# Patient Record
Sex: Male | Born: 1985 | Race: White | Hispanic: No | Marital: Single | Smoking: Never smoker
Health system: Southern US, Community
[De-identification: ages and names within clinical notes are randomized; demographics above are authoritative.]

## PROBLEM LIST (undated history)

## (undated) ENCOUNTER — Ambulatory Visit: Discharge status: Absent without leave | Payer: Self-pay

## (undated) HISTORY — PX: HERNIA REPAIR: SHX51

---

## 2006-04-08 HISTORY — PX: KNEE SURGERY: SHX244

## 2017-01-08 ENCOUNTER — Ambulatory Visit
Admission: EM | Admit: 2017-01-08 | Discharge: 2017-01-08 | Disposition: A | Discharge status: Home / Self Care | Payer: Managed Care, Other (non HMO) | Attending: Family Medicine | Admitting: Family Medicine

## 2017-01-08 DIAGNOSIS — R590 Localized enlarged lymph nodes: Secondary | ICD-10-CM

## 2017-01-08 NOTE — ED Triage Notes (Signed)
Patient complains of swollen glands x 4 weeks. Patient states that throat is not painful, just swollen.

## 2017-01-08 NOTE — Discharge Instructions (Signed)
Monitor for 2 more weeks and follow up if still present

## 2017-01-08 NOTE — ED Provider Notes (Signed)
[facility]    CSN: [id number] Arrival date & time: 01/08/17  1515     History   Chief Complaint Chief Complaint  Patient presents with  . Lymphadenopathy    HPI Derek Sanders is a 31 y.o. male.   31 yo male with a c/o swollen glands just below the jaw for the last 3 weeks since he had an upper respiratory infection. States about 3 week sago he had "head cold" with sore throat, nasal congestion, runny nose and cough, which resolved, however patient has had persistence of the swollen glands. States otherwise feels well. Denies any fevers, chills, weight loss, cough.     The history is provided by the patient.    History reviewed. No pertinent past medical history.  There are no active problems to display for this patient.   Past Surgical History:  Procedure Laterality Date  . HERNIA REPAIR    . KNEE SURGERY Bilateral 2008       Home Medications    Prior to Admission medications   Not on File    Family History History reviewed. No pertinent family history.  Social History Social History  Substance Use Topics  . Smoking status: Never Smoker  . Smokeless tobacco: Never Used  . Alcohol use Yes     Comment: occasionally     Allergies   Patient has no known allergies.   Review of Systems Review of Systems   Physical Exam Triage Vital Signs ED Triage Vitals  Enc Vitals Group     BP 01/08/17 1532 123/66     Pulse Rate 01/08/17 1532 75     Resp 01/08/17 1532 18     Temp 01/08/17 1532 98.4 F (36.9 C)     Temp Source 01/08/17 1532 Oral     SpO2 01/08/17 1532 99 %     Weight 01/08/17 1530 150 lb (68 kg)     Height 01/08/17 1530  (1.753 m)     Head Circumference --      Peak Flow --      Pain Score 01/08/17 1530 0     Pain Loc --      Pain Edu? --      Excl. in GC? --    No data found.   Updated Vital Signs BP 123/66 (BP Location: Left Arm)   Pulse 75   Temp 98.4 F (36.9 C) (Oral)   Resp 18   Ht  (1.753 m)   Wt  150 lb (68 kg)   SpO2 99%   BMI 22.15 kg/m   Visual Acuity Right Eye Distance:   Left Eye Distance:   Bilateral Distance:    Right Eye Near:   Left Eye Near:    Bilateral Near:     Physical Exam  Constitutional: He appears well-developed and well-nourished. No distress.  Neck: Normal range of motion. Neck supple.  Mild bilateral submandibular lymphadenopathy; mobile, non-tender  Lymphadenopathy:    He has no cervical adenopathy.  Skin: He is not diaphoretic.  Nursing note and vitals reviewed.    UC Treatments / Results  Labs (all labs ordered are listed, but only abnormal results are displayed) Labs Reviewed  RAPID STREP SCREEN (NOT AT [facility])    EKG  EKG Interpretation None       Radiology No results found.  Procedures Procedures (including critical care time)  Medications Ordered in UC Medications - No data to display   Initial Impression / Assessment and Plan /  UC Course  I have reviewed the triage vital signs and the nursing notes.  Pertinent labs & imaging results that were available during my care of the patient were reviewed by me and considered in my medical decision making (see chart for details).        Final Clinical Impressions(s) / UC Diagnoses   Final diagnoses:  Lymphadenopathy, submandibular  (post-infectious)  New Prescriptions There are no discharge medications for this patient.  1. diagnosis reviewed with patient 2. Recommend monitoring and follow up in 2 with PCP or ENT if still present or sooner if worsen   Controlled Substance Prescriptions [place] Controlled Substance Registry consulted? Not Applicable   Payton Mccallum, MD 01/08/17 1620

## 2017-02-03 ENCOUNTER — Other Ambulatory Visit: Payer: Self-pay | Admitting: Otolaryngology

## 2017-02-03 DIAGNOSIS — R590 Localized enlarged lymph nodes: Secondary | ICD-10-CM

## 2017-02-11 ENCOUNTER — Ambulatory Visit
Admission: RE | Admit: 2017-02-11 | Discharge: 2017-02-11 | Disposition: A | Discharge status: Home / Self Care | Payer: Managed Care, Other (non HMO) | Source: Ambulatory Visit | Attending: Otolaryngology | Admitting: Otolaryngology

## 2017-02-11 DIAGNOSIS — R590 Localized enlarged lymph nodes: Secondary | ICD-10-CM | POA: Diagnosis present

## 2017-02-11 DIAGNOSIS — J029 Acute pharyngitis, unspecified: Secondary | ICD-10-CM | POA: Diagnosis not present

## 2017-02-11 MED ORDER — IOPAMIDOL (ISOVUE-300) INJECTION 61%
75.0000 mL | Freq: Once | INTRAVENOUS | Status: AC | PRN
  Administered 2017-02-11: 75 mL via INTRAVENOUS

## 2017-03-26 ENCOUNTER — Other Ambulatory Visit: Payer: Self-pay | Admitting: Otolaryngology

## 2017-03-26 DIAGNOSIS — R221 Localized swelling, mass and lump, neck: Secondary | ICD-10-CM

## 2017-03-27 ENCOUNTER — Other Ambulatory Visit: Payer: Self-pay | Admitting: Otolaryngology

## 2017-03-27 DIAGNOSIS — R221 Localized swelling, mass and lump, neck: Secondary | ICD-10-CM

## 2017-03-31 ENCOUNTER — Ambulatory Visit
Admission: RE | Admit: 2017-03-31 | Discharge: 2017-03-31 | Disposition: A | Discharge status: Home / Self Care | Payer: Managed Care, Other (non HMO) | Source: Ambulatory Visit | Attending: Otolaryngology | Admitting: Otolaryngology

## 2017-03-31 ENCOUNTER — Ambulatory Visit: Payer: Managed Care, Other (non HMO)

## 2017-03-31 DIAGNOSIS — R221 Localized swelling, mass and lump, neck: Secondary | ICD-10-CM

## 2017-04-11 ENCOUNTER — Ambulatory Visit: Payer: Managed Care, Other (non HMO)

## 2018-02-01 IMAGING — US US SOFT TISSUE HEAD/NECK
1 series · 14 of 25 positions shown · non-contrast
Comparison: None.

CLINICAL DATA: Sub lingual swelling and tenderness for the past
several months.

EXAM:
ULTRASOUND OF HEAD/NECK SOFT TISSUES
TECHNIQUE: Ultrasound examination of the head and neck soft tissues was
performed in the area of clinical concern.

[Series 1: us soft tissue head/neck · 0.07mm/px · 38 acquisitions, 14 frames shown]
[im 1/38]
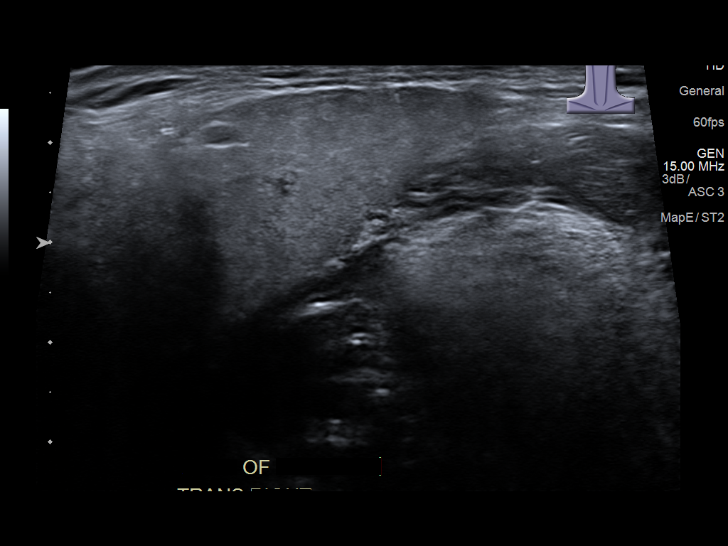
[im 4/38]
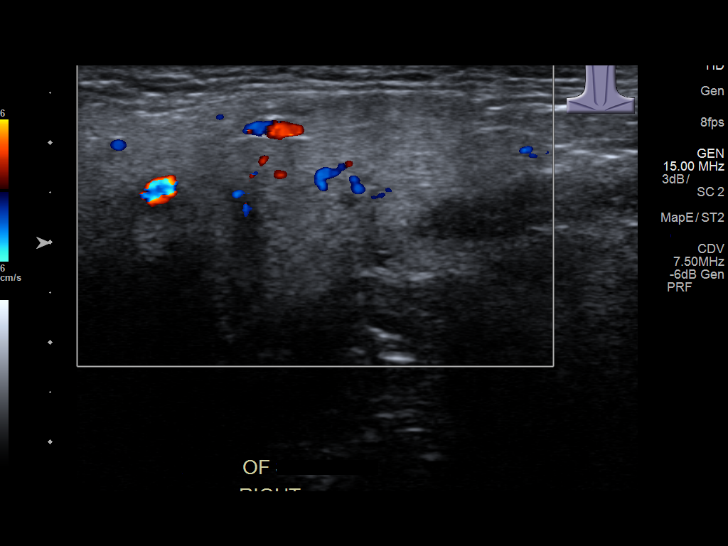
[im 7/38]
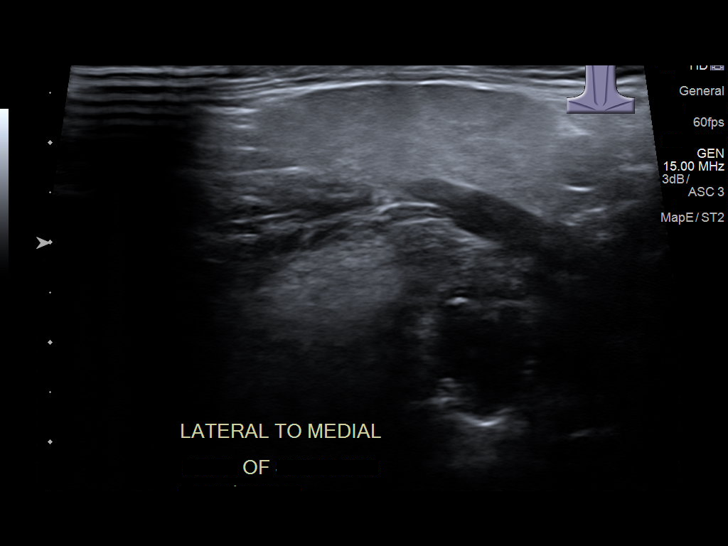
[im 10/38]
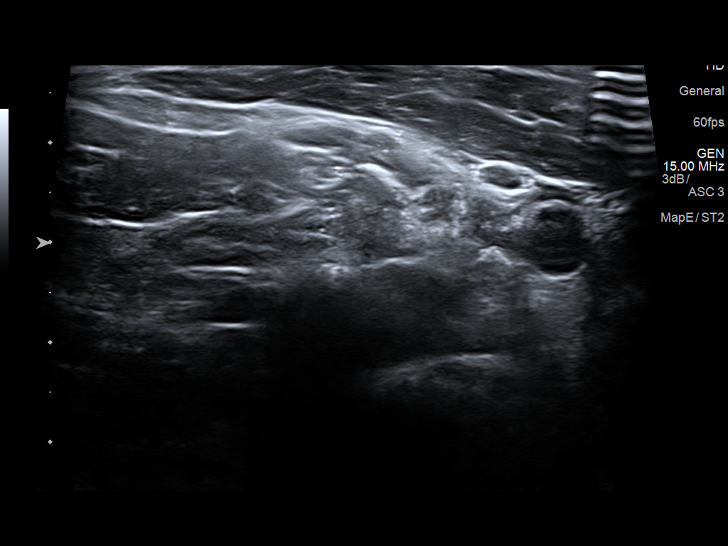
[im 13/38]
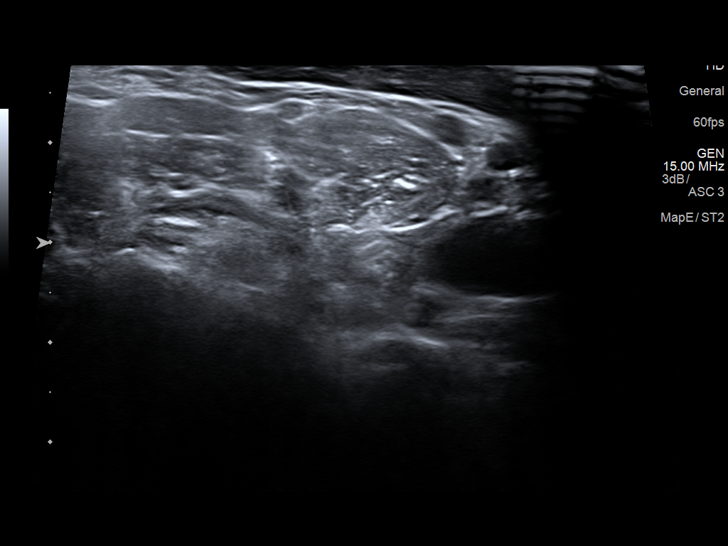
[im 14/38]
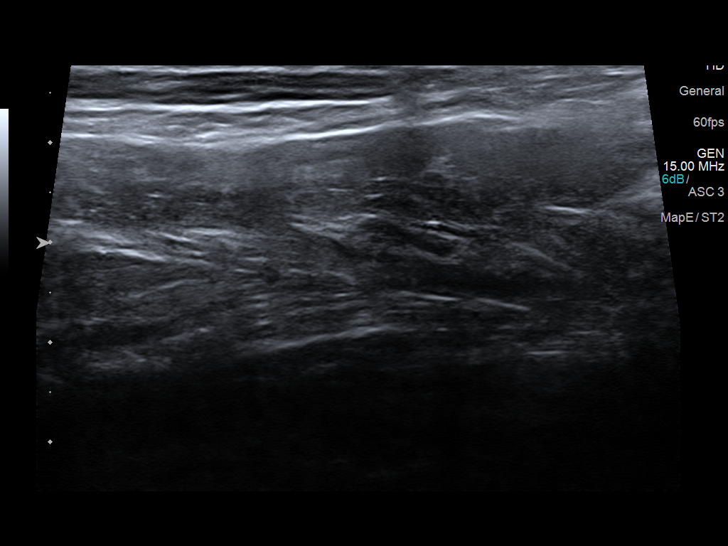
[im 17/38]
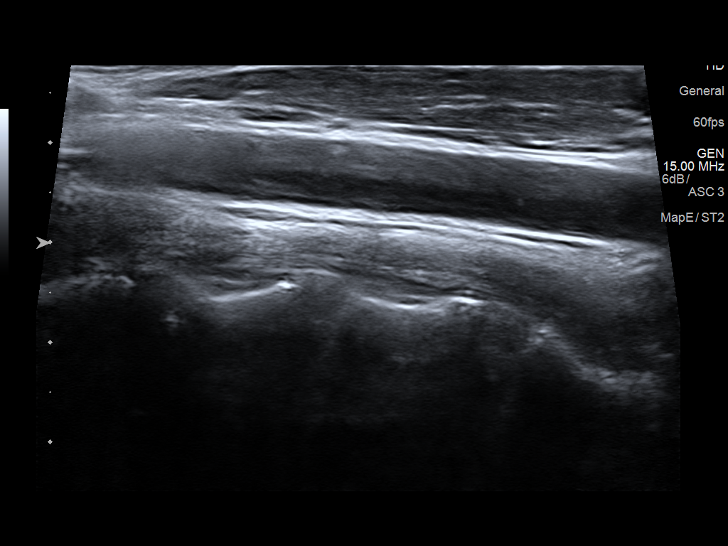
[im 21/38]
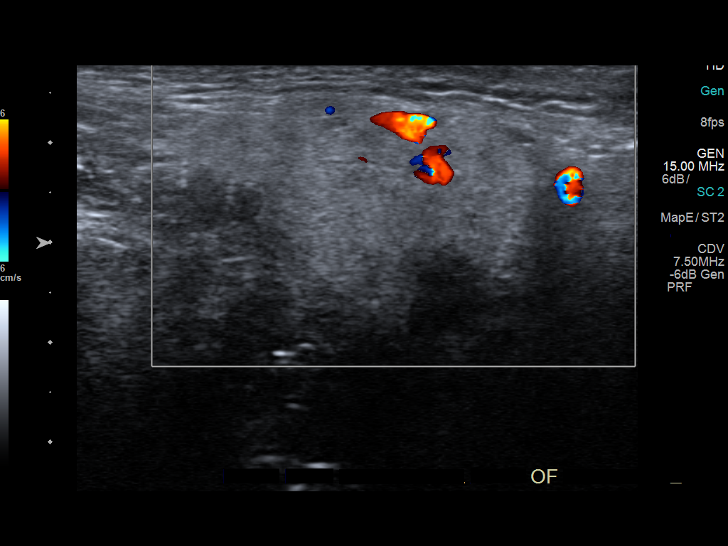
[im 24/38]
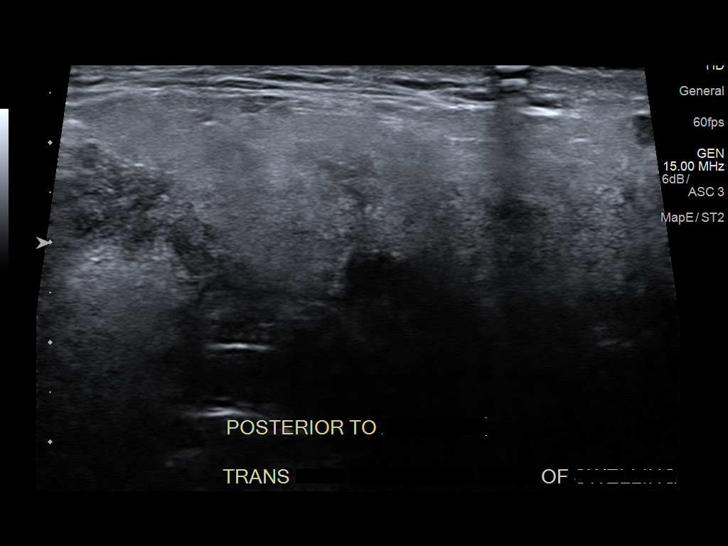
[im 25/38]
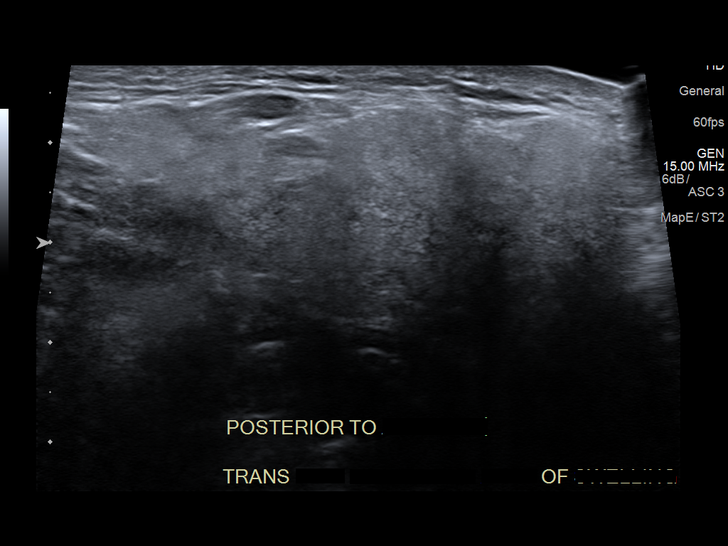
[im 28/38]
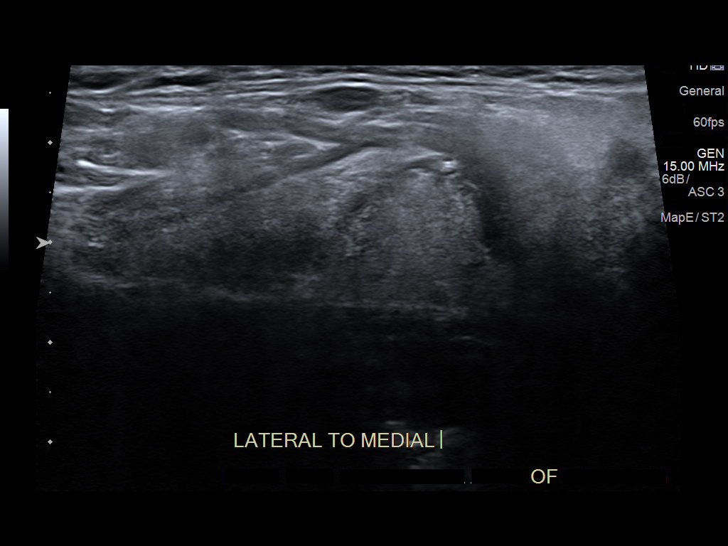
[im 31/38]
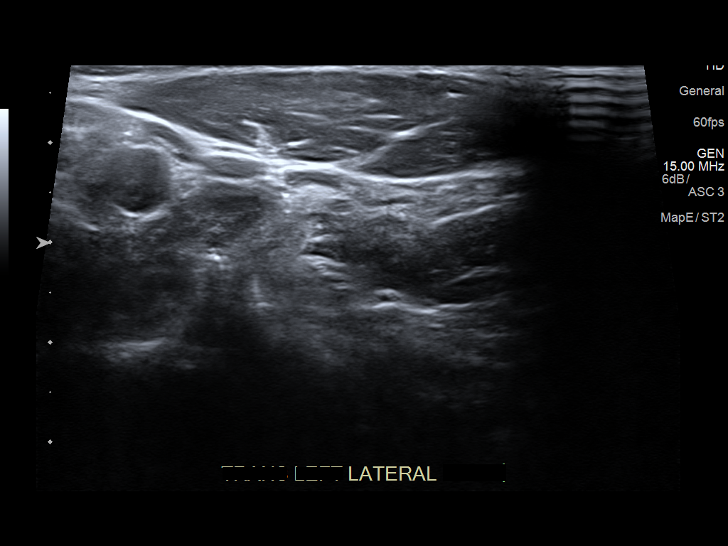
[im 34/38]
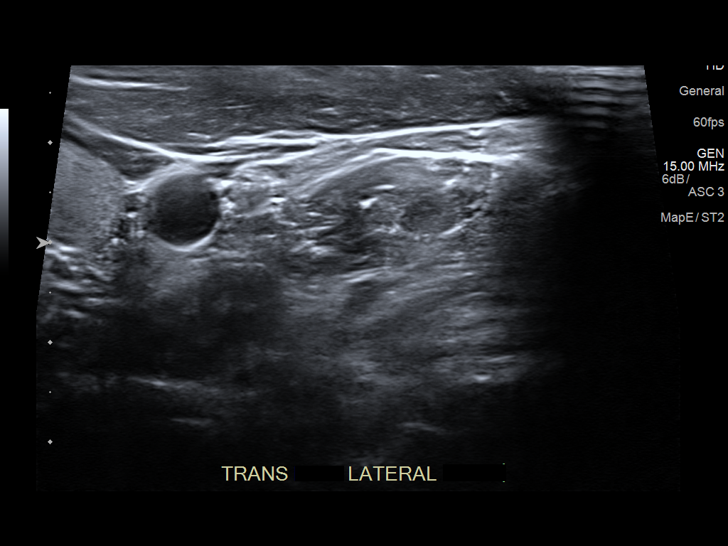
[im 38/38]
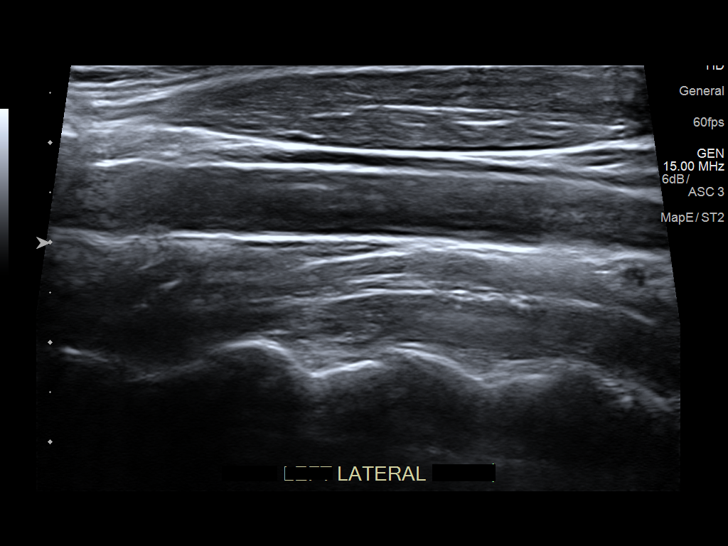

[14 of 25 positions shown; findings below may reference images not displayed]

FINDINGS: Sonographic images of patient's palpable area of concern
demonstrates normal appearance of the imaged portions of the
bilateral submandibular and sublingual glands. No definitive
intraglandular lesion. No ductal dilatation. No regional cervical
lymphadenopathy.
IMPRESSION: No sonographic correlate for patient's perceived area of pain and
swelling. Normal appearance of the imaged portions of the bilateral
submandibular and sublingual glands.

## 2019-02-07 IMAGING — CT CT NECK W/ CM
2 of 3 series · 8 of 14 positions shown, 9 images · IV contrast (iopamidol)
Comparison: Non

CLINICAL DATA: Bilateral submandibular knots over the last 2 weeks.
Pharyngitis. Bilateral hilar adenopathy syndrome.

EXAM:
CT NECK WITH CONTRAST
TECHNIQUE: Multidetector CT imaging of the neck was performed using the
standard protocol following the bolus administration of intravenous
contrast.
CONTRAST:  75mL 0BUK3P-SUU IOPAMIDOL (0BUK3P-SUU) INJECTION 61%

[Series 2: axial neck · axial · 0.52mm/px · z∈[-258,-100]mm · 4 of 133 slices shown]
[im 27/133  bone]
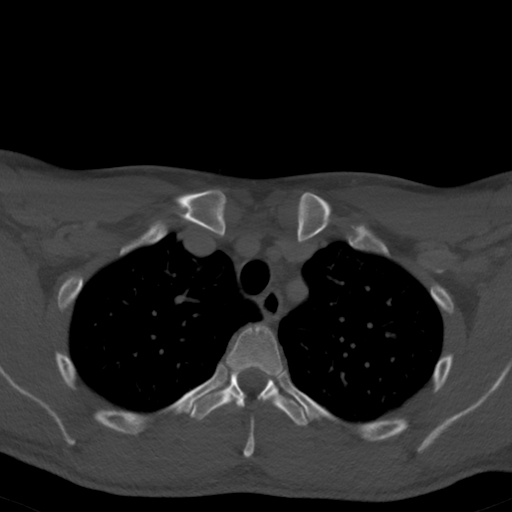
[im 53/133  bone]
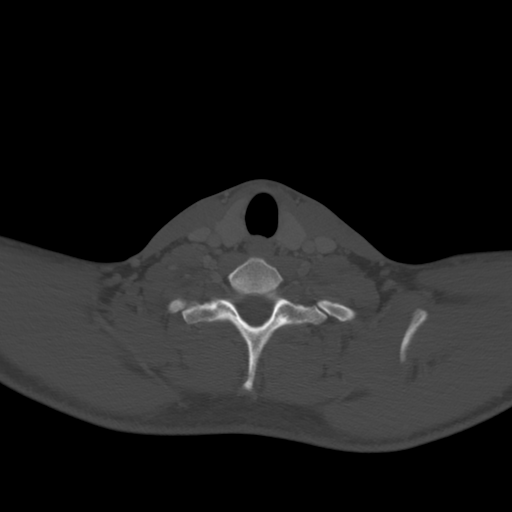
[im 80/133  bone]
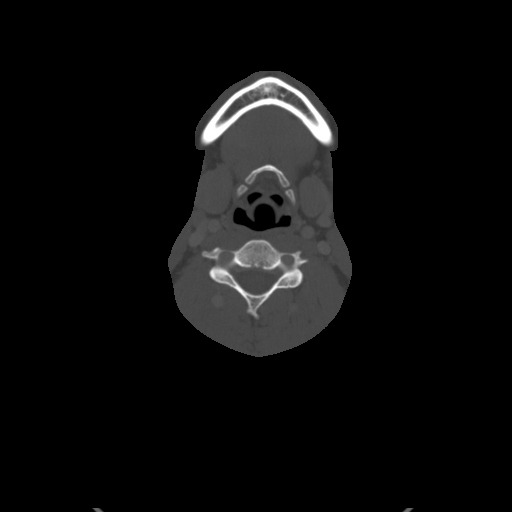
[im 106/133  bone]
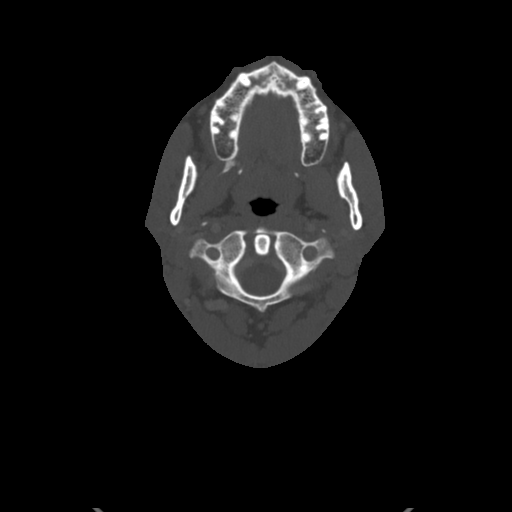

[Series 8: orthogonal ax · axial · 0.47mm/px · z∈[-299,-111]mm · 4 of 161 slices shown, 5 images]
[im 33/161  soft-tissue]
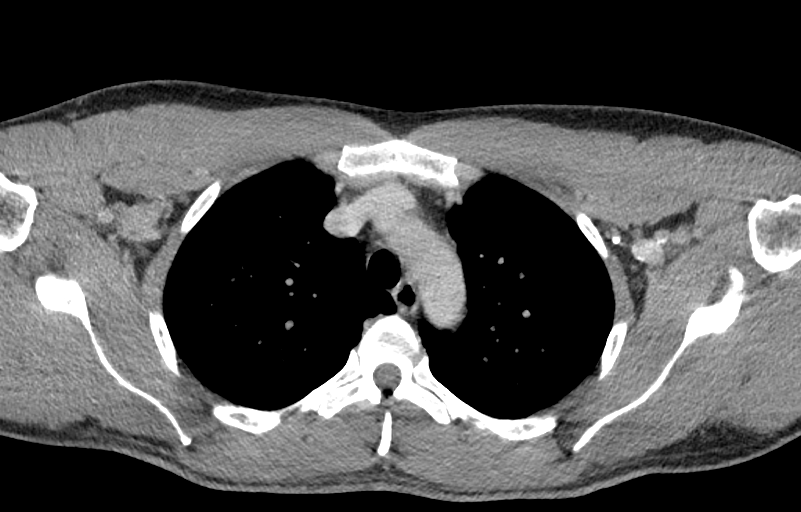
[im 33/161  bone]
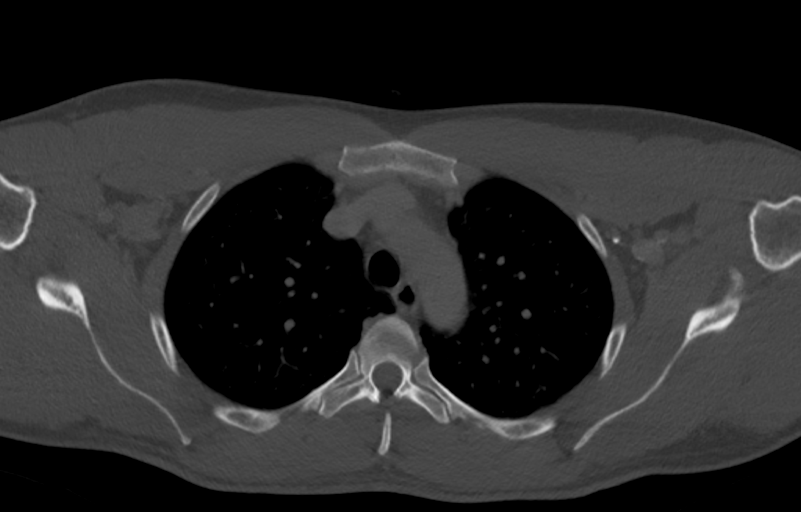
[im 65/161  bone]
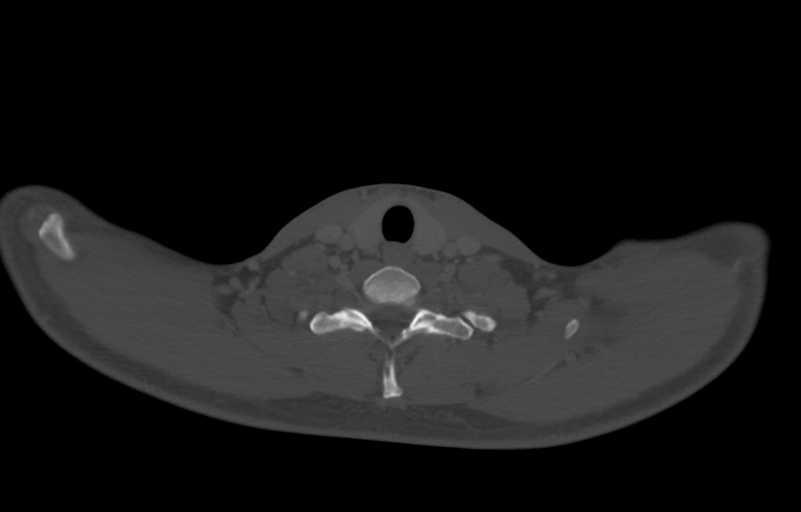
[im 97/161  bone]
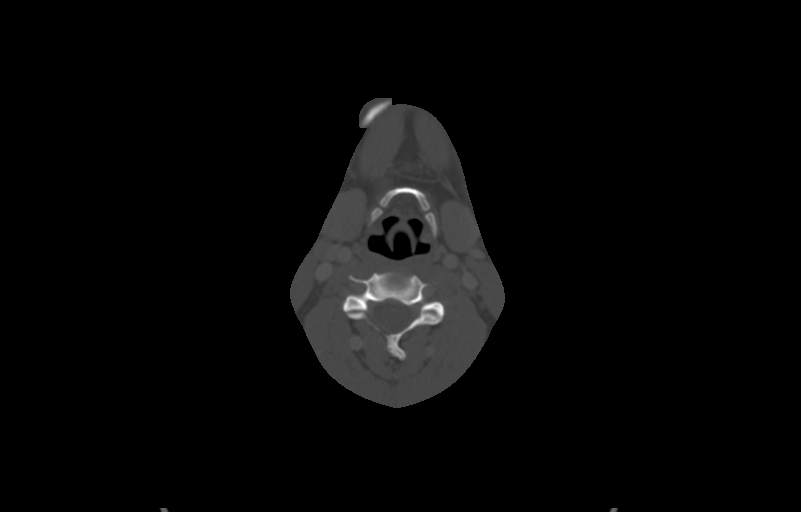
[im 129/161  bone]
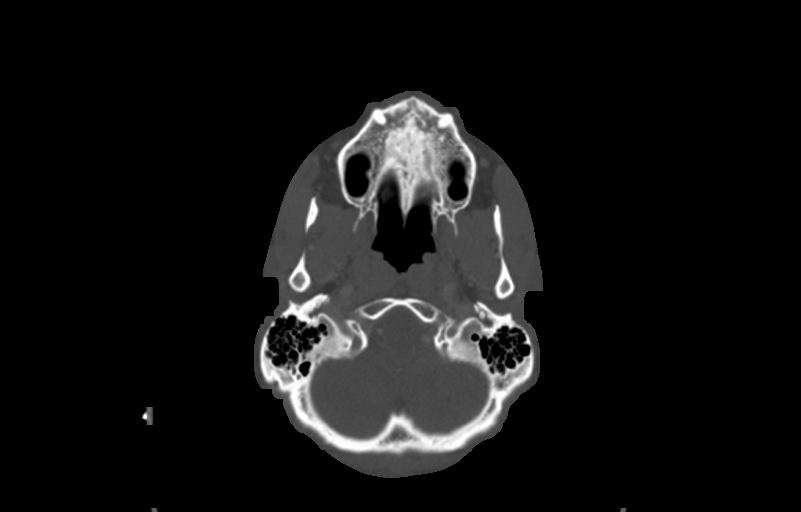

[8 of 14 positions shown; findings below may reference images not displayed]

FINDINGS: Pharynx and larynx: No focal mucosal or submucosal lesions are
present. Palatine and lingual tonsils are mildly hypertrophic. No
discrete mass lesion is present. The soft palate, epiglottis, and
vocal cords are within normal limits. The nasopharynx, oropharynx,
and hypopharynx are clear. The trachea is unremarkable.

Salivary glands: The submandibular glands are within normal limits
bilaterally.

Thyroid: Negative

Lymph nodes: Asymmetric elongated enlarged nodes are present at the
left level 2 station. Slightly smaller nodes are present at the
right level 2 station. These appear reactive. No rounded or necrotic
nodes are present. Multiple subcentimeter level 3 and posterior
triangle lymph nodes are noted bilaterally. Subcentimeter sublingual
and submental lymph nodes are also present bilaterally.

Vascular: Negative.

Limited intracranial: Limited imaging the brain is within normal
limits.

Visualized orbits: Visualized orbits are normal.

Mastoids and visualized paranasal sinuses: The visualized paranasal
sinuses and mastoid air cells are clear.

Skeleton: Unremarkable.

Upper chest: The lung apices are clear.
IMPRESSION: 1. Bilateral the level 2 cervical lymph node enlargement, left
greater than right. This appears reactive in the setting of recent
pharyngitis.
2. Other smaller subcentimeter lymph nodes along both sides the neck
of are also likely reactive.
3. Mild prominence of the palatine and the lingual tonsils
compatible with recent pharyngitis.
4. No focal mass lesion or evidence for neoplasm.

## 2020-07-03 ENCOUNTER — Other Ambulatory Visit: Payer: Self-pay

## 2020-07-03 ENCOUNTER — Ambulatory Visit
Admission: RE | Admit: 2020-07-03 | Discharge: 2020-07-03 | Disposition: A | Discharge status: Home / Self Care | Payer: BLUE CROSS/BLUE SHIELD | Source: Ambulatory Visit | Attending: Physician Assistant | Admitting: Physician Assistant

## 2020-07-03 VITALS — BP 120/76 | HR 65 | Temp 98.1°F | Resp 18 | Ht 69.0 in | Wt 145.0 lb

## 2020-07-03 DIAGNOSIS — R059 Cough, unspecified: Secondary | ICD-10-CM | POA: Diagnosis not present

## 2020-07-03 DIAGNOSIS — J029 Acute pharyngitis, unspecified: Secondary | ICD-10-CM | POA: Diagnosis not present

## 2020-07-03 DIAGNOSIS — B349 Viral infection, unspecified: Secondary | ICD-10-CM | POA: Diagnosis not present

## 2020-07-03 LAB — GROUP A STREP BY PCR: Group A Strep by PCR: NOT DETECTED

## 2020-07-03 NOTE — ED Triage Notes (Signed)
Patient in today c/o cough and sore throat x 2 days. Patient states he has been sick over the past month with URI symptoms and a stomach bug. He states that he has gotten better from that. Patient has had the covid vaccines, no booster. Patient did 2 home covid test during his previous illness and both were negative. Patient has not taken any OTC medications to help with his symptoms.

## 2020-07-03 NOTE — ED Provider Notes (Signed)
[facility]    CSN: [id number] Arrival date & time: 07/03/20  1158      History   Chief Complaint Chief Complaint  Patient presents with  . Appointment  . Sore Throat  . Cough    HPI Derek Sanders is a 35 y.o. male presenting for sore throat x2 days.  He says that the throat was more sore this morning but has improved a little bit today.  Patient admits to being sick with multiple suspected illnesses over the past month.  He says that he started out with a head cold which then turned into a chest cold and then a "sinus infection."  He says that he then got a stomach bug that lasted about 12 hours where he had vomiting and diarrhea.  Patient says the vomiting and diarrhea symptoms were 2 weeks ago and he has not had any recent symptoms like that.  He denies any fever, fatigue, body aches.  He admits to occasionally still having a cough that is productive, especially when he exercises but denies any chest discomfort or breathing difficulty.  Patient denies any sick contacts.  Denies any known exposure to COVID-19.  He has been vaccinated for COVID-19 without a booster.  He is taken to Covid test which been negative.  He says he is not really taking any over-the-counter medication for symptoms other than a multivitamin and elderberry.  He says that his cough and congestion have greatly improved but the sore throat is new and his main complaint today.  Denies any history of cardiopulmonary disease.  No history of allergies.  No other complaints or concerns.  HPI  History reviewed. No pertinent past medical history.  There are no problems to display for this patient.   Past Surgical History:  Procedure Laterality Date  . HERNIA REPAIR    . KNEE SURGERY Bilateral 2008       Home Medications    Prior to Admission medications   Medication Sig Start Date End Date Taking? Authorizing Provider  Ascorbic Acid (VITAMIN C PO) Take 1 tablet by mouth daily.   Yes [provider]  ELDERBERRY PO Take 1 tablet by mouth daily.   Yes [provider]  Multiple Vitamin (MULTI-VITAMIN) tablet Take 1 tablet by mouth daily.   Yes [provider]  VITAMIN D PO Take 1 tablet by mouth daily.   Yes [provider]    Family History Family History  Problem Relation Age of Onset  . Healthy Mother   . Healthy Father     Social History Social History   Tobacco Use  . Smoking status: Never Smoker  . Smokeless tobacco: Never Used  Vaping Use  . Vaping Use: Never used  Substance Use Topics  . Alcohol use: Yes    Comment: occasionally  . Drug use: No     Allergies   Patient has no known allergies.   Review of Systems Review of Systems  Constitutional: Negative for fatigue and fever.  HENT: Positive for sore throat. Negative for congestion, rhinorrhea, sinus pressure and sinus pain.   Respiratory: Positive for cough. Negative for shortness of breath.   Cardiovascular: Negative for chest pain.  Gastrointestinal: Negative for abdominal pain, diarrhea, nausea and vomiting.  Musculoskeletal: Negative for myalgias.  Neurological: Negative for weakness, light-headedness and headaches.  Hematological: Negative for adenopathy.     Physical Exam Triage Vital Signs ED Triage Vitals  Enc Vitals Group     BP 07/03/20 1213  120/76     Pulse Rate 07/03/20 1213 65     Resp 07/03/20 1213 18     Temp 07/03/20 1213 98.1 F (36.7 C)     Temp Source 07/03/20 1213 Oral     SpO2 07/03/20 1213 100 %     Weight 07/03/20 1213 145 lb (65.8 kg)     Height 07/03/20 1213 5\' 9"  (1.753 m)     Head Circumference --      Peak Flow --      Pain Score 07/03/20 1212 7     Pain Loc --      Pain Edu? --      Excl. in GC? --    No data found.  Updated Vital Signs BP 120/76 (BP Location: Left Arm)   Pulse 65   Temp 98.1 F (36.7 C) (Oral)   Resp 18   Ht 5\' 9"  (1.753 m)   Wt 145 lb (65.8 kg)   SpO2 100%   BMI 21.41 kg/m        Physical Exam Vitals and nursing note reviewed.  Constitutional:      General: He is not in acute distress.    Appearance: Normal appearance. He is well-developed. He is not ill-appearing or toxic-appearing.  HENT:     Head: Normocephalic and atraumatic.     Nose: Nose normal.     Mouth/Throat:     Mouth: Mucous membranes are moist.     Pharynx: Oropharynx is clear. Posterior oropharyngeal erythema present.     Tonsils: 1+ on the right. 1+ on the left.  Eyes:     General: No scleral icterus.    Conjunctiva/sclera: Conjunctivae normal.  Cardiovascular:     Rate and Rhythm: Normal rate and regular rhythm.     Heart sounds: Normal heart sounds.  Pulmonary:     Effort: Pulmonary effort is normal. No respiratory distress.     Breath sounds: Normal breath sounds.  Musculoskeletal:        General: Injury:      Cervical back: Neck supple.  Skin:    General: Skin is warm and dry.  Neurological:     General: No focal deficit present.     Mental Status: He is alert. Mental status is at baseline.     Motor: No weakness.     Gait: Gait normal.  Psychiatric:        Mood and Affect: Mood normal.        Behavior: Behavior normal.        Thought Content: Thought content normal.      UC Treatments / Results  Labs (all labs ordered are listed, but only abnormal results are displayed) Labs Reviewed  GROUP A STREP BY PCR    EKG   Radiology No results found.  Procedures Procedures (including critical care time)  Medications Ordered in UC Medications - No data to display  Initial Impression / Assessment and Plan / UC Course  I have reviewed the triage vital signs and the nursing notes.  Pertinent labs & imaging results that were available during my care of the patient were reviewed by me and considered in my medical decision making (see chart for details).    35 year old male presenting for sore throat x2 days.  Admits to continued cough over the past month which has  improved as time is gone.  No associated chest discomfort or breathing difficulty.  All vital signs are normal and stable in the clinic.  He is  overall well-appearing.  Molecular strep test obtained today. Negative.   I discussed strep test results and did offer CXR due to duration of cough, but he declined at this time stating the cough is improving.  Offered COVID and flu testing but he declined that as well as Rx for cough mediation or viscous lidocaine.   Advised likely viral illness and should continue to improve but he should return if symptoms worsen or especially if cough worsens, he has a fever, chest pain or breathing difficulty.   Final Clinical Impressions(s) / UC Diagnoses   Final diagnoses:  Viral illness  Sore throat  Cough     Discharge Instructions     URI/COLD SYMPTOMS: Your exam today is consistent with a viral illness. Antibiotics are not indicated at this time. Use medications as directed, including cough syrup, nasal saline, and decongestants. Your symptoms should improve over the next few days and resolve within 7-10 days. Increase rest and fluids. F/u if symptoms worsen or predominate such as sore throat, ear pain, productive cough, shortness of breath, or if you develop high fevers or worsening fatigue over the next several days.      ED Prescriptions    None     PDMP not reviewed this encounter.   Shirlee Latch, PA-C 07/03/20 1333

## 2020-07-03 NOTE — Discharge Instructions (Signed)
URI/COLD SYMPTOMS: Your exam today is consistent with a viral illness. Antibiotics are not indicated at this time. Use medications as directed, including cough syrup, nasal saline, and decongestants. Your symptoms should improve over the next few days and resolve within 7-10 days. Increase rest and fluids. F/u if symptoms worsen or predominate such as sore throat, ear pain, productive cough, shortness of breath, or if you develop high fevers or worsening fatigue over the next several days.    

## 2020-09-15 ENCOUNTER — Ambulatory Visit
Admission: RE | Admit: 2020-09-15 | Discharge: 2020-09-15 | Disposition: A | Discharge status: Home / Self Care | Payer: BLUE CROSS/BLUE SHIELD | Source: Ambulatory Visit | Attending: Physician Assistant | Admitting: Physician Assistant

## 2020-09-15 ENCOUNTER — Other Ambulatory Visit: Payer: Self-pay

## 2020-09-15 VITALS — BP 113/78 | HR 64 | Temp 98.2°F | Resp 16 | Ht 69.0 in | Wt 140.0 lb

## 2020-09-15 DIAGNOSIS — R591 Generalized enlarged lymph nodes: Secondary | ICD-10-CM

## 2020-09-15 LAB — COMPREHENSIVE METABOLIC PANEL
ALT: 37 U/L (ref 0–44)
AST: 37 U/L (ref 15–41)
Albumin: 5.1 g/dL — ABNORMAL HIGH (ref 3.5–5.0)
Alkaline Phosphatase: 52 U/L (ref 38–126)
Anion gap: 7 (ref 5–15)
BUN: 14 mg/dL (ref 6–20)
CO2: 29 mmol/L (ref 22–32)
Calcium: 9.8 mg/dL (ref 8.9–10.3)
Chloride: 102 mmol/L (ref 98–111)
Creatinine, Ser: 0.84 mg/dL (ref 0.61–1.24)
GFR, Estimated: >60 mL/min (ref >60)
Glucose, Bld: 94 mg/dL (ref 70–99)
Potassium: 4.4 mmol/L (ref 3.5–5.1)
Sodium: 138 mmol/L (ref 135–145)
Total Bilirubin: 1.1 mg/dL (ref 0.3–1.2)
Total Protein: 8.1 g/dL (ref 6.5–8.1)

## 2020-09-15 LAB — CBC WITH DIFFERENTIAL/PLATELET
Abs Immature Granulocytes: 0.01 10*3/uL (ref 0.00–0.07)
Basophils Absolute: 0 10*3/uL (ref 0.0–0.1)
Basophils Relative: 1 %
Eosinophils Absolute: 0 10*3/uL (ref 0.0–0.5)
Eosinophils Relative: 1 %
HCT: 43.5 % (ref 39.0–52.0)
Hemoglobin: 15.3 g/dL (ref 13.0–17.0)
Immature Granulocytes: 0 %
Lymphocytes Relative: 46 %
Lymphs Abs: 1.7 10*3/uL (ref 0.7–4.0)
MCH: 30.2 pg (ref 26.0–34.0)
MCHC: 35.2 g/dL (ref 30.0–36.0)
MCV: 86 fL (ref 80.0–100.0)
Monocytes Absolute: 0.3 10*3/uL (ref 0.1–1.0)
Monocytes Relative: 8 %
Neutro Abs: 1.6 10*3/uL — ABNORMAL LOW (ref 1.7–7.7)
Neutrophils Relative %: 44 %
Platelets: 295 10*3/uL (ref 150–400)
RBC: 5.06 MIL/uL (ref 4.22–5.81)
RDW: 12.8 % (ref 11.5–15.5)
WBC: 3.6 10*3/uL — ABNORMAL LOW (ref 4.0–10.5)
nRBC: 0 % (ref 0.0–0.2)

## 2020-09-15 LAB — MONONUCLEOSIS SCREEN: Mono Screen: NEGATIVE

## 2020-09-15 NOTE — Discharge Instructions (Addendum)
I will contact you if any of your lab results are abnormal.  In the meantime, please try to work on finding a PCP.  I have included a phone number below that you can check.  Have also put a note into at one of the nursing coordinators to try to help you find a PCP.  This is something that needs monitoring.  It may go away on its own or could persist and you could develop new swollen lymph nodes in which case she may need imaging.  [facility] at [facility], [address], [address]. Phone number: [phone] Please call this number to establish with primary care

## 2020-09-15 NOTE — ED Triage Notes (Signed)
Patient complains of right sided swollen lymphnode. Denies any pain.

## 2020-09-15 NOTE — ED Provider Notes (Signed)
[facility]    CSN: [id number] Arrival date & time: 09/15/20  1150      History   Chief Complaint Chief Complaint  Patient presents with   Lymphadenopathy    HPI Derek Sanders is a 35 y.o. male presenting for complaints about swollen area of his right anterior neck.  Patient says its been there about a week.  Denies any change in size noticed not sore.  He says it feels like a lymph node but a little bit more firm.  He also has bilateral submandibular lymph nodes but says he has had CT scans and they have been normal.  Patient says he feels well and he has not been sick.  He denies any headaches, ear pain, sore throat, congestion or cough.  He is breathing fine.  Patient was ill about 2 months ago but says he did not have swollen lymph nodes at that time.  He denies fevers, night sweats, fatigue or achiness.  Patient does not have a PCP.  No other concerns.  HPI  History reviewed. No pertinent past medical history.  There are no problems to display for this patient.   Past Surgical History:  Procedure Laterality Date   HERNIA REPAIR     KNEE SURGERY Bilateral 2008       Home Medications    Prior to Admission medications   Medication Sig Start Date End Date Taking? Authorizing Provider  Ascorbic Acid (VITAMIN C PO) Take 1 tablet by mouth daily.   Yes [provider]  Multiple Vitamin (MULTI-VITAMIN) tablet Take 1 tablet by mouth daily.   Yes [provider]  VITAMIN D PO Take 1 tablet by mouth daily.   Yes [provider]  ELDERBERRY PO Take 1 tablet by mouth daily.    [provider]    Family History Family History  Problem Relation Age of Onset   Healthy Mother    Healthy Father     Social History Social History   Tobacco Use   Smoking status: Never   Smokeless tobacco: Never  Vaping Use   Vaping Use: Never used  Substance Use Topics   Alcohol use: Yes    Comment: occasionally   Drug use: No      Allergies   Patient has no known allergies.   Review of Systems Review of Systems  Constitutional:  Negative for fatigue and fever.  HENT:  Negative for congestion, ear pain, rhinorrhea, sinus pressure, sinus pain and sore throat.   Respiratory:  Negative for cough and shortness of breath.   Cardiovascular:  Negative for chest pain.  Gastrointestinal:  Negative for abdominal pain, diarrhea, nausea and vomiting.  Musculoskeletal:  Negative for myalgias.  Neurological:  Negative for weakness, light-headedness and headaches.  Hematological:  Positive for adenopathy.    Physical Exam Triage Vital Signs ED Triage Vitals  Enc Vitals Group     BP 09/15/20 1240 113/78     Pulse Rate 09/15/20 1240 64     Resp 09/15/20 1240 16     Temp 09/15/20 1240 98.2 F (36.8 C)     Temp Source 09/15/20 1240 Oral     SpO2 09/15/20 1240 97 %     Weight 09/15/20 1238 140 lb (63.5 kg)     Height 09/15/20 1238 5\' 9"  (1.753 m)     Head Circumference --      Peak Flow --      Pain Score 09/15/20 1238 0  Pain Loc --      Pain Edu? --      Excl. in GC? --    No data found.  Updated Vital Signs BP 113/78 (BP Location: Left Arm)   Pulse 64   Temp 98.2 F (36.8 C) (Oral)   Resp 16   Ht 5\' 9"  (1.753 m)   Wt 140 lb (63.5 kg)   SpO2 97%   BMI 20.67 kg/m      Physical Exam Vitals and nursing note reviewed.  Constitutional:      General: He is not in acute distress.    Appearance: Normal appearance. He is well-developed. He is not ill-appearing.  HENT:     Head: Normocephalic and atraumatic.     Right Ear: Tympanic membrane, ear canal and external ear normal.     Left Ear: Tympanic membrane, ear canal and external ear normal.     Nose: Nose normal.     Mouth/Throat:     Mouth: Mucous membranes are moist.     Pharynx: Oropharynx is clear.  Eyes:     General: No scleral icterus.    Conjunctiva/sclera: Conjunctivae normal.  Cardiovascular:     Rate and Rhythm: Normal rate and  regular rhythm.     Heart sounds: Normal heart sounds.  Pulmonary:     Effort: Pulmonary effort is normal. No respiratory distress.     Breath sounds: Normal breath sounds.  Musculoskeletal:     Cervical back: Neck supple.  Lymphadenopathy:     Cervical: Cervical adenopathy (small mobile right anterior lymph node, bilateral small submandibular lymph nodes, small right posterior cervical enlarged lymph node. All are non tender) present.  Skin:    General: Skin is warm and dry.  Neurological:     General: No focal deficit present.     Mental Status: He is alert. Mental status is at baseline.     Motor: No weakness.     Gait: Gait normal.  Psychiatric:        Mood and Affect: Mood normal.        Behavior: Behavior normal.        Thought Content: Thought content normal.     UC Treatments / Results  Labs (all labs ordered are listed, but only abnormal results are displayed) Labs Reviewed  CBC WITH DIFFERENTIAL/PLATELET - Abnormal; Notable for the following components:      Result Value   WBC 3.6 (*)    Neutro Abs 1.6 (*)    All other components within normal limits  COMPREHENSIVE METABOLIC PANEL - Abnormal; Notable for the following components:   Albumin 5.1 (*)    All other components within normal limits  MONONUCLEOSIS SCREEN    EKG   Radiology No results found.  Procedures Procedures (including critical care time)  Medications Ordered in UC Medications - No data to display  Initial Impression / Assessment and Plan / UC Course  I have reviewed the triage vital signs and the nursing notes.  Pertinent labs & imaging results that were available during my care of the patient were reviewed by me and considered in my medical decision making (see chart for details).  35 year old healthy male presenting for concerns regarding a swollen lump of his right anterior neck.  He denies any associated symptoms.  He has not had any fevers, fatigue, night sweats, cough, congestion,  sore throats, chest pain, breathing difficulty, nausea/vomiting or abdominal pain.  Patient's vital signs are all normal and stable today.  He is  well-appearing.  Exam does reveal a small mobile enlarged right anterior cervical lymph node as well as a small right posterior lower cervical lymph node into bilateral small submandibular lymph nodes.  All are nontender.  The remainder the exam is normal.  Patient does not have a PCP and is concerned about this.  I did offer to perform labs.  CBC, CMP and mono obtained today.  Advised him I will contact him with any abnormal results.  Otherwise, advised results will be on MyChart.  Patient given contact information for member and primary care.  Also put in a note to the nursing staff to help with PCP assistance.  Advised him to continue looking on his own as well.  This is something that needs to be followed up on since he has multiple enlarged lymph nodes.  At this time, supportive care.  Advised to follow-up for any symptoms that develop such as fever, fatigue, sweats, achiness, cough, congestion, sore throat, breathing problem.  Patient agreeable.  Reviewed patient labs.  CBC, CMP and mono all unremarkable.  He does have access his lab results.  Final Clinical Impressions(s) / UC Diagnoses   Final diagnoses:  Lymphadenopathy of head and neck     Discharge Instructions      I will contact you if any of your lab results are abnormal.  In the meantime, please try to work on finding a PCP.  I have included a phone number below that you can check.  Have also put a note into at one of the nursing coordinators to try to help you find a PCP.  This is something that needs monitoring.  It may go away on its own or could persist and you could develop new swollen lymph nodes in which case she may need imaging.  [facility] at [facility], [address], [address]. Phone number: [phone] Please call this number to establish with primary  care      ED Prescriptions   None    PDMP not reviewed this encounter.   Shirlee Latch, PA-C 09/15/20 1438

## 2020-10-13 ENCOUNTER — Telehealth: Payer: Self-pay

## 2020-10-13 NOTE — Telephone Encounter (Signed)
Patient already established

## 2020-10-13 NOTE — Telephone Encounter (Signed)
-----   Message from Aaron Edelman, RN sent at 09/21/2020  8:23 AM EDT ----- Regarding: UC to PCP Patient needs to establish with PCP - routine
